# Patient Record
Sex: Female | Born: 2005 | Race: Black or African American | Hispanic: No | Marital: Single | Smoking: Never smoker
Health system: Southern US, Community
[De-identification: ages and names within clinical notes are randomized; demographics above are authoritative.]

---
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
## 2024-01-21 NOTE — Progress Notes (Signed)
 SUBJECTIVE:   Chief compliant/HPI: annual examination  Alicia Santiago is a 18 y.o. who presents today for an annual exam.   Discussed the use of AI scribe software for clinical note transcription with the patient, who gave verbal consent to proceed.  History of Present Illness Alicia Santiago "Ty" is an 18 year old female who presents for an annual physical exam.  She has exercise-induced asthma but is not using her albuterol  inhaler and feels okay during physical activities. She has eczema with no current symptoms.  She consumes alcohol weekly, typically in larger quantities on weekends, and uses marijuana and vapes daily. She is sexually active without using birth control or protection.  OBJECTIVE:   BP (!) 100/57   Pulse 92   Ht 5\' 3"  (1.6 m)   Wt 111 lb 3.2 oz (50.4 kg)   LMP 01/19/2024   SpO2 100%   BMI 19.70 kg/m   General: Well appearing, NAD, awake, alert, responsive to questions Head: Normocephalic atraumatic, no neck masses or adenopathy CV: Regular rate and rhythm no murmurs rubs or gallops Respiratory: Clear to ausculation bilaterally, no wheezes rales or crackles, chest rises symmetrically,  no increased work of breathing Abdomen: Soft, non-tender, non-distended, normoactive bowel sounds  Extremities: Moves upper and lower extremities freely, no edema in LE Neuro: No focal deficits Skin: No rashes or lesions visualized  ASSESSMENT/PLAN:   Assessment & Plan Screening examination for STI - HIV antibody (with reflex) - Hepatitis C antibody (reflex, frozen specimen) - RPR - Cervicovaginal ancillary only; Future Alcohol consumption binge drinking Counseled on decreased usage - CMP for LFT eval Vaping nicotine dependence, non-tobacco product Frequent vaping, counseled.  Quit line provided-not interested in any medication prescribed today. Marijuana use Counseled, not interested in cutting back at this moment  Annual Examination  See AVS for age  appropriate recommendations.   PHQ score     01/22/2024    1:41 PM 08/22/2023    8:48 AM 01/26/2023   10:48 AM 11/14/2022    1:25 PM 05/06/2022    8:34 AM  Depression screen PHQ 2/9  Decreased Interest 1 0 0 1 2  Down, Depressed, Hopeless 1 0 0 1 1  PHQ - 2 Score 2 0 0 2 3  Altered sleeping 2 0 0 3 2  Tired, decreased energy 2 0 1 2 2   Change in appetite 1 2 1 1 2   Feeling bad or failure about yourself  0 0 0 1 0  Trouble concentrating 0 0 0 2 2  Moving slowly or fidgety/restless 0 1 0 0 0  Suicidal thoughts 0 0 0 0 0  PHQ-9 Score 7 3 2 11 11   Difficult doing work/chores Not difficult at all  Not difficult at all Somewhat difficult   , reviewed and discussed. Blood pressure reviewed and at goal  The patient currently uses nothing for contraception.  Not interested in any kind of birth control.  Folate recommended as appropriate, minimum of 400 mcg per day.   Considered the following items based upon USPSTF recommendations: HIV testing: ordered Hepatitis C: ordered Hepatitis B: discussed Syphilis if at high risk: ordered GC/CT 24 or  younger, ordered.  Patient will return for self swab, does not want a swab today Lipid panel (nonfasting or fasting) discussed based upon AHA recommendations and not ordered.  Consider repeat every 4-6 years.   Cervical cancer screening: not indicated as not yet age 43.  MyChart Activation:Already signed up   Follow up in  1  year or sooner if indicated.    Genora Kidd, MD [facility]

## 2024-01-22 ENCOUNTER — Encounter: Payer: Self-pay | Admitting: Student

## 2024-01-22 ENCOUNTER — Ambulatory Visit (INDEPENDENT_AMBULATORY_CARE_PROVIDER_SITE_OTHER): Payer: Self-pay | Admitting: Student

## 2024-01-22 VITALS — BP 100/57 | HR 92 | Ht 63.0 in | Wt 111.2 lb

## 2024-01-22 DIAGNOSIS — F1010 Alcohol abuse, uncomplicated: Secondary | ICD-10-CM | POA: Insufficient documentation

## 2024-01-22 DIAGNOSIS — F1290 Cannabis use, unspecified, uncomplicated: Secondary | ICD-10-CM | POA: Diagnosis not present

## 2024-01-22 DIAGNOSIS — Z113 Encounter for screening for infections with a predominantly sexual mode of transmission: Secondary | ICD-10-CM | POA: Diagnosis not present

## 2024-01-22 DIAGNOSIS — F17200 Nicotine dependence, unspecified, uncomplicated: Secondary | ICD-10-CM | POA: Diagnosis not present

## 2024-01-22 NOTE — Assessment & Plan Note (Signed)
 Counseled, not interested in cutting back at this moment

## 2024-01-22 NOTE — Patient Instructions (Addendum)
  VISIT SUMMARY: Today, you came in for your annual physical exam. We discussed your exercise-induced asthma, eczema, and lifestyle habits, including alcohol consumption, vaping, and marijuana use. We also talked about your sexual health and family medical history.  YOUR PLAN: -VAPING: You mentioned that you vape daily and are interested in reducing your usage. We discussed options for quitting, and I provided you with a quit line card to help you stop vaping.  -ANTICIPATORY GUIDANCE: We talked about the risks associated with alcohol, marijuana, and vaping. I advised you to moderate your alcohol intake and to try to reduce vaping. I also recommended that you start taking a multivitamin with folic acid.  Please let us  know if you are interested in taking any birth control pill. Additionally, I ordered a comprehensive metabolic panel (CMP) to check your liver enzymes and recommended screening for sexually transmitted diseases (STDs) with a blood test and swab.  INSTRUCTIONS: Please follow up with the recommended blood tests and swab for STD screening ( I have placed a future order for this and you can make a lab appointment for a self swab). Also, get the comprehensive metabolic panel (CMP) done to check your liver enzymes. Start taking a multivitamin with folic acid as discussed. If you have any questions or concerns, feel free to reach out.                      Contains text generated by [employer].                                 Contains text generated by [employer].

## 2024-01-22 NOTE — Assessment & Plan Note (Signed)
 Counseled on decreased usage - CMP for LFT eval

## 2024-01-22 NOTE — Assessment & Plan Note (Signed)
 Frequent vaping, counseled.  Quit line provided-not interested in any medication prescribed today.

## 2024-01-23 ENCOUNTER — Ambulatory Visit: Payer: Self-pay | Admitting: Student

## 2024-01-23 LAB — COMPREHENSIVE METABOLIC PANEL WITH GFR
ALT: 16 IU/L (ref 0–32)
AST: 17 IU/L (ref 0–40)
Albumin: 4.1 g/dL (ref 4.0–5.0)
Alkaline Phosphatase: 55 IU/L (ref 42–106)
BUN/Creatinine Ratio: 7 — ABNORMAL LOW (ref 9–23)
BUN: 6 mg/dL (ref 6–20)
Bilirubin Total: 0.2 mg/dL (ref 0.0–1.2)
CO2: 20 mmol/L (ref 20–29)
Calcium: 9.9 mg/dL (ref 8.7–10.2)
Chloride: 105 mmol/L (ref 96–106)
Creatinine, Ser: 0.89 mg/dL (ref 0.57–1.00)
Globulin, Total: 2.9 g/dL (ref 1.5–4.5)
Glucose: 70 mg/dL (ref 70–99)
Potassium: 4.2 mmol/L (ref 3.5–5.2)
Sodium: 142 mmol/L (ref 134–144)
Total Protein: 7 g/dL (ref 6.0–8.5)
eGFR: 96 mL/min/{1.73_m2} (ref >59)

## 2024-01-23 LAB — RPR: RPR Ser Ql: NONREACTIVE

## 2024-01-23 LAB — HCV INTERPRETATION

## 2024-01-23 LAB — HIV ANTIBODY (ROUTINE TESTING W REFLEX): HIV Screen 4th Generation wRfx: NONREACTIVE

## 2024-01-23 LAB — HCV AB W REFLEX TO QUANT PCR: HCV Ab: NONREACTIVE

## 2024-02-05 ENCOUNTER — Other Ambulatory Visit (HOSPITAL_COMMUNITY)
Admission: RE | Admit: 2024-02-05 | Discharge: 2024-02-05 | Disposition: A | Discharge status: Home / Self Care | Source: Ambulatory Visit | Attending: Family Medicine | Admitting: Family Medicine

## 2024-02-05 ENCOUNTER — Ambulatory Visit (INDEPENDENT_AMBULATORY_CARE_PROVIDER_SITE_OTHER): Payer: Self-pay | Admitting: Student

## 2024-02-05 ENCOUNTER — Encounter: Payer: Self-pay | Admitting: Student

## 2024-02-05 VITALS — BP 94/53 | HR 81 | Ht 63.0 in | Wt 112.2 lb

## 2024-02-05 DIAGNOSIS — Z113 Encounter for screening for infections with a predominantly sexual mode of transmission: Secondary | ICD-10-CM

## 2024-02-05 DIAGNOSIS — R0981 Nasal congestion: Secondary | ICD-10-CM

## 2024-02-05 MED ORDER — LORATADINE 10 MG PO TABS: 10.0000 mg | ORAL_TABLET | Freq: Every day | ORAL | 11 refills | Status: AC

## 2024-02-05 MED ORDER — FLUTICASONE PROPIONATE 50 MCG/ACT NA SUSP: 2.0000 | Freq: Every day | NASAL | 6 refills | Status: AC

## 2024-02-05 NOTE — Progress Notes (Addendum)
    SUBJECTIVE:   CHIEF COMPLAINT / HPI: Screening   Discussed the use of AI scribe software for clinical note transcription with the patient, who gave verbal consent to proceed.  History of Present Illness Alicia Daffern "Ty" is an 18 year old female who presents for a screening test and evaluation of low blood pressure.  Has low BP but does not experience lightheadedness or fatigue, except in hot weather. Her water intake has decreased from previous levels.   Nasal congestion worsens at night, and she is not using nasal sprays or allergy medications. She seeks treatment options for congestion.  Patient does not use contraception, sexually active with females only. No vaginal discharge or sxs   PERTINENT  PMH / PSH: Exercise-induced asthma, eczema  OBJECTIVE:   BP (!) 94/53   Pulse 81   Ht 5\' 3"  (1.6 m)   Wt 112 lb 4 oz (50.9 kg)   LMP 01/19/2024   SpO2 100%   BMI 19.88 kg/m   General: Well appearing, NAD, awake, alert, responsive to questions Head: Normocephalic atraumatic, nasal congestion b/l CV: Regular rate and rhythm no murmurs rubs or gallops Respiratory: Clear to ausculation bilaterally, no wheezes rales or crackles, chest rises symmetrically,  no increased work of breathing Abdomen: Soft, non-tender Extremities: Moves upper and lower extremities freely  ASSESSMENT/PLAN:   Assessment & Plan Screening for STD (sexually transmitted disease) Routine screenings normal. No STD concerns. Advised on sexual health and birth control. - Continue routine health screenings. - Recommend multivitamin with folate. - Maintain awareness for STD screenings. Nasal congestion Nasal congestion likely due to nasal canal inflammation. Not using nasal sprays or allergy medications. - Prescribe Flonase nasal spray. - Claritin if symptoms persist.  Low blood pressure Blood pressure low but normal for age. No symptoms except in hot weather. - Advise to report symptoms like  lightheadedness or tiredness.  Genora Kidd, MD [facility]

## 2024-02-05 NOTE — Patient Instructions (Addendum)
 It was great to see you! Thank you for allowing me to participate in your care!   Our plans for today:  - I will keep you updated on what your gonorrhea and Chlamydia swabs show - I will send in Flonase for you to do daily to help with congestion as well as claritin to do daily to help with that nasal congestion as well  Take care and seek immediate care sooner if you develop any concerns.  Genora Kidd, MD

## 2024-02-06 ENCOUNTER — Ambulatory Visit: Payer: Self-pay | Admitting: Student

## 2024-02-06 LAB — CERVICOVAGINAL ANCILLARY ONLY
Chlamydia: NEGATIVE
Comment: NEGATIVE
Comment: NEGATIVE
Comment: NORMAL
Neisseria Gonorrhea: NEGATIVE
Trichomonas: NEGATIVE

## 2024-07-22 ENCOUNTER — Encounter: Payer: Self-pay | Admitting: Student

## 2024-07-31 ENCOUNTER — Other Ambulatory Visit (HOSPITAL_COMMUNITY)
Admission: RE | Admit: 2024-07-31 | Discharge: 2024-07-31 | Disposition: A | Discharge status: Home / Self Care | Source: Ambulatory Visit | Attending: Family Medicine | Admitting: Family Medicine

## 2024-07-31 ENCOUNTER — Ambulatory Visit: Payer: Self-pay | Admitting: Family Medicine

## 2024-07-31 VITALS — BP 120/75 | HR 85 | Ht 63.0 in | Wt 116.4 lb

## 2024-07-31 DIAGNOSIS — Z113 Encounter for screening for infections with a predominantly sexual mode of transmission: Secondary | ICD-10-CM

## 2024-07-31 DIAGNOSIS — K5900 Constipation, unspecified: Secondary | ICD-10-CM

## 2024-07-31 DIAGNOSIS — K920 Hematemesis: Secondary | ICD-10-CM

## 2024-07-31 MED ORDER — PANTOPRAZOLE SODIUM 40 MG PO TBEC: 40.0000 mg | DELAYED_RELEASE_TABLET | Freq: Every day | ORAL | 3 refills | Status: AC

## 2024-07-31 MED ORDER — ONDANSETRON 4 MG PO TBDP: 4.0000 mg | ORAL_TABLET | Freq: Three times a day (TID) | ORAL | 0 refills | Status: AC | PRN

## 2024-07-31 MED ORDER — POLYETHYLENE GLYCOL 3350 17 GM/SCOOP PO POWD: 17.0000 g | Freq: Two times a day (BID) | ORAL | 1 refills | Status: AC | PRN

## 2024-07-31 NOTE — Patient Instructions (Signed)
 I have sent in zofran  for nausea during periods. This could be due to estrogen increase. Let me know if you are interested in birth control to help this.  We collected labs today for STIs.  I have sent in protonix  to take every day since you have blood in your vomit. I have also sent you to see the stomach doctors to further evaluate. Let me know if you are worsening before you see them!

## 2024-07-31 NOTE — Progress Notes (Addendum)
    SUBJECTIVE:   CHIEF COMPLAINT / HPI:   STI screening No new partners. One female partner. She is at lower risk of pregnancy because of this. Does not desire PrEP or birth control at this time. No symptoms.  Nausea, vomiting with periods At least first day of the period. Sometimes multiple times while on period. Periods are regular. LMP 11/2 and should come back around next week. Sometimes forceful. Some abdominal pain when pressing on it. Also constipated sometimes. No other episodes of bleeding. No reflux symptoms. Does not binge drink alcohol anymore. Does not use NSAIDs often. Does smoke weed.  OBJECTIVE:   BP 120/75   Pulse 85   Ht 5' 3 (1.6 m)   Wt 116 lb 6.4 oz (52.8 kg)   SpO2 100%   BMI 20.62 kg/m   General: Alert and oriented, in NAD Skin: Warm, dry, and intact without lesions HEENT: NCAT, EOM grossly normal, midline nasal septum Cardiac: RRR, no m/r/g appreciated Respiratory: CTAB, breathing and speaking comfortably on RA Abdominal: Soft, mildly TTP throughout, nondistended, normoactive bowel sounds Extremities: Moves all extremities grossly equally Neurological: No gross focal deficit Psychiatric: Appropriate mood and affect   ASSESSMENT/PLAN:   Assessment & Plan Routine screening for STI (sexually transmitted infection) Self-swab collected for GC/chlamydia and trichomonas at request today. RPR, HIV, HCV collected as well. Hematemesis with nausea Constipation, unspecified constipation type With periods. Could be due to effects of estrogen. Marijuana could also be contributing. Could also be due to PUD from prior binge drinking or Mallory-Weiss with the forceful vomiting. Declines birth control today but will think about this. Vitals and exam overall stable today. Will collect CBC to monitor for blood loss. I have also sent in protonix  to take daily and zofran  to take prn nausea. For intermittent constipation, can use miralax . I have referred to GI for endoscopic  evaluation.  She declines vaccinations today.   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  Stuart Redo, MD [facility]

## 2024-08-01 ENCOUNTER — Ambulatory Visit: Payer: Self-pay | Admitting: Family Medicine

## 2024-08-01 LAB — CBC WITH DIFFERENTIAL/PLATELET
Basophils Absolute: 0 x10E3/uL (ref 0.0–0.2)
Basos: 1 %
EOS (ABSOLUTE): 0 x10E3/uL (ref 0.0–0.4)
Eos: 0 %
Hematocrit: 38.8 % (ref 34.0–46.6)
Hemoglobin: 12.7 g/dL (ref 11.1–15.9)
Immature Grans (Abs): 0 x10E3/uL (ref 0.0–0.1)
Immature Granulocytes: 0 %
Lymphocytes Absolute: 1.8 x10E3/uL (ref 0.7–3.1)
Lymphs: 44 %
MCH: 31.2 pg (ref 26.6–33.0)
MCHC: 32.7 g/dL (ref 31.5–35.7)
MCV: 95 fL (ref 79–97)
Monocytes Absolute: 0.4 x10E3/uL (ref 0.1–0.9)
Monocytes: 9 %
Neutrophils Absolute: 1.9 x10E3/uL (ref 1.4–7.0)
Neutrophils: 46 %
Platelets: 286 x10E3/uL (ref 150–450)
RBC: 4.07 x10E6/uL (ref 3.77–5.28)
RDW: 12.3 % (ref 11.7–15.4)
WBC: 4.1 x10E3/uL (ref 3.4–10.8)

## 2024-08-01 LAB — SYPHILIS: RPR W/REFLEX TO RPR TITER AND TREPONEMAL ANTIBODIES, TRADITIONAL SCREENING AND DIAGNOSIS ALGORITHM: RPR Ser Ql: NONREACTIVE

## 2024-08-01 LAB — HEPATITIS C ANTIBODY: Hep C Virus Ab: NONREACTIVE

## 2024-08-01 LAB — HIV ANTIBODY (ROUTINE TESTING W REFLEX): HIV Screen 4th Generation wRfx: NONREACTIVE

## 2024-08-02 LAB — CERVICOVAGINAL ANCILLARY ONLY
Chlamydia: NEGATIVE
Comment: NEGATIVE
Comment: NEGATIVE
Comment: NORMAL
Neisseria Gonorrhea: NEGATIVE
Trichomonas: NEGATIVE

## 2024-09-06 ENCOUNTER — Ambulatory Visit: Payer: Self-pay
